# Patient Record
Sex: Male | Born: 1970 | Race: Black or African American | Hispanic: No | Marital: Single | Smoking: Never smoker
Health system: Southern US, Community
[De-identification: ages and names within clinical notes are randomized; demographics above are authoritative.]

---

## 2015-02-28 ENCOUNTER — Emergency Department
Admit: 2015-02-28 | Disposition: A | Discharge status: Home / Self Care | Payer: Self-pay | Admitting: Emergency Medicine

## 2015-02-28 LAB — COMPREHENSIVE METABOLIC PANEL
ALT: 24 U/L
AST: 23 U/L
Albumin: 4.4 g/dL
Alkaline Phosphatase: 56 U/L
Anion Gap: 7 (ref 7–16)
BILIRUBIN TOTAL: 0.4 mg/dL
BUN: 15 mg/dL
CO2: 26 mmol/L
Calcium, Total: 9.1 mg/dL
Chloride: 101 mmol/L
Creatinine: 1.08 mg/dL
EGFR (African American): >60
GLUCOSE: 121 mg/dL — AB
POTASSIUM: 3.4 mmol/L — AB
Sodium: 134 mmol/L — ABNORMAL LOW
Total Protein: 8.2 g/dL — ABNORMAL HIGH

## 2015-02-28 LAB — CBC WITH DIFFERENTIAL/PLATELET
BASOS PCT: 0.5 %
Basophil #: 0 10*3/uL (ref 0.0–0.1)
EOS ABS: 0.1 10*3/uL (ref 0.0–0.7)
Eosinophil %: 1.5 %
HCT: 41 % (ref 40.0–52.0)
HGB: 13.5 g/dL (ref 13.0–18.0)
Lymphocyte #: 2.7 10*3/uL (ref 1.0–3.6)
Lymphocyte %: 32.6 %
MCH: 29.6 pg (ref 26.0–34.0)
MCHC: 33 g/dL (ref 32.0–36.0)
MCV: 90 fL (ref 80–100)
MONO ABS: 0.7 x10 3/mm (ref 0.2–1.0)
Monocyte %: 8.1 %
NEUTROS ABS: 4.7 10*3/uL (ref 1.4–6.5)
Neutrophil %: 57.3 %
Platelet: 258 10*3/uL (ref 150–440)
RBC: 4.57 10*6/uL (ref 4.40–5.90)
RDW: 13.5 % (ref 11.5–14.5)
WBC: 8.2 10*3/uL (ref 3.8–10.6)

## 2015-09-20 ENCOUNTER — Encounter: Payer: Self-pay | Admitting: Emergency Medicine

## 2015-09-20 ENCOUNTER — Ambulatory Visit
Admission: EM | Admit: 2015-09-20 | Discharge: 2015-09-20 | Disposition: A | Discharge status: Home / Self Care | Payer: BLUE CROSS/BLUE SHIELD | Attending: Family Medicine | Admitting: Family Medicine

## 2015-09-20 DIAGNOSIS — M79605 Pain in left leg: Secondary | ICD-10-CM

## 2015-09-20 DIAGNOSIS — M7989 Other specified soft tissue disorders: Secondary | ICD-10-CM

## 2015-09-20 DIAGNOSIS — M79662 Pain in left lower leg: Secondary | ICD-10-CM

## 2015-09-20 NOTE — ED Notes (Signed)
Pt with left leg swelling x 2 weeks

## 2015-09-20 NOTE — ED Provider Notes (Signed)
[facility]  ____________________________________________  Time seen: Approximately 5:56 PM  I have reviewed the triage vital signs and the nursing notes.   HISTORY  Chief Complaint Leg Swelling   HPI Nathan Blake is a 44 y.o. male  presents with a complaint of left lower leg swelling 2 weeks. Patient states that this is a gradual onset. Patient denies fall or injury.  States he has some occasional swelling in both legs after standing and sitting long periods but states left leg has persisted. States some left lower leg pain. States left lower leg pain is 4 out of 10 aching and feels tight. States he did notice an area of redness with some break in skin. Patient states that he has had a scar to his left lower leg for years and states around that area down there is scaly skin with a break in skin. Denies drainage. Reports continues to ambulate well but with pain in left lower leg. States pain is primarily with walking. States some pain at rest. States pain is also present with areas touched. Denies pain in left lower extremity above left distal lower leg.   Denies chest pain, shortness of breath, abdominal pain, right lower extremity pain. Denies history of similar. States that he feels well other than redness, swelling and tenderness to left lower leg.  Patient states that he came tonight to make sure that it was not a blood clot.   History reviewed. No pertinent past medical history.  There are no active problems to display for this patient.   History reviewed. No pertinent past surgical history.  Current Outpatient Rx  Name  Route  Sig  Dispense  Refill  . cyclobenzaprine (FLEXERIL) 10 MG tablet   Oral   Take 10 mg by mouth 3 (three) times daily as needed for muscle spasms.           Allergies Review of patient's allergies indicates no known allergies.  History reviewed. No pertinent family history.  Social History Social History  Substance Use Topics  .  Smoking status: Never Smoker   . Smokeless tobacco: None  . Alcohol Use: Yes    Review of Systems Constitutional: No fever/chills Eyes: No visual changes. ENT: No sore throat. Cardiovascular: Denies chest pain. Respiratory: Denies shortness of breath. Gastrointestinal: No abdominal pain.  No nausea, no vomiting.  No diarrhea.  No constipation. Genitourinary: Negative for dysuria. Musculoskeletal: Negative for back pain. left leg pain as above.  Skin: Negative for rash. Neurological: Negative for headaches, focal weakness or numbness.  10-point ROS otherwise negative.  ____________________________________________   PHYSICAL EXAM:  VITAL SIGNS: ED Triage Vitals  Enc Vitals Group     BP 09/20/15 1723 135/77 mmHg     Pulse Rate 09/20/15 1723 91     Resp 09/20/15 1723 20     Temp 09/20/15 1723 98.3 F (36.8 C)     Temp Source 09/20/15 1723 Tympanic     SpO2 09/20/15 1723 99 %     Weight 09/20/15 1723 330 lb (149.687 kg)     Height 09/20/15 1723  (1.93 m)     Head Cir --      Peak Flow --      Pain Score 09/20/15 1725 8     Pain Loc --      Pain Edu? --      Excl. in GC? --     Constitutional: Alert and oriented. Well appearing and in no acute distress. Eyes: Conjunctivae are  normal. PERRL. EOMI. Head: Atraumatic.  Nose: No congestion/rhinnorhea.  Mouth/Throat: Mucous membranes are moist. Neck: No stridor.  No cervical spine tenderness to palpation. Hematological/Lymphatic/Immunilogical: No cervical lymphadenopathy. Cardiovascular: Normal rate, regular rhythm. Grossly normal heart sounds.  Good peripheral circulation. Respiratory: Normal respiratory effort.  No retractions. Lungs CTAB. No wheezes, rales or rhonchi.  Gastrointestinal: Soft and nontender. No distention. Normal Bowel sounds.  No abdominal bruits. No CVA tenderness. Musculoskeletal: No lower or upper extremity tenderness nor edema.  No joint effusions. Bilateral pedal pulses equal and easily palpated.   Except: mild to moderate nonpitting edema left lower leg with mild to moderate distal tibial fibular area erythema with noted area medial distal tib-fib area of superficial abrasion with mild scaling skin. No fluctuance or induration. No drainage. Calf nontender.Bilateral dorsalis pedis and posterior tibialis pulses equal and easily palpated.  Neurologic:  Normal speech and language. No gross focal neurologic deficits are appreciated. No gait instability. Skin:  Skin is warm, dry and intact. No rash noted. Psychiatric: Mood and affect are normal. Speech and behavior are normal.  ____________________________________________   LABS (all labs ordered are listed, but only abnormal results are displayed)  Labs Reviewed - No data to display __________________________________________   INITIAL IMPRESSION / ASSESSMENT AND PLAN / ED COURSE  Pertinent labs & imaging results that were available during my care of the patient were reviewed by me and considered in my medical decision making (see chart for details).  Very well-appearing patient. No acute distress. Patient was ambulating and up and walking in room upon entering room. Presents for approximately 2 weeks of gradual onset of redness, swelling and tenderness to left distal leg. Patient with mild to moderate nonpitting edema left lower leg with mild to moderate distal tibial fibular area erythema with noted area medial distal tib-fib area of superficial abrasion with mild scaling skin. No fluctuance or induration. No drainage. Calf nontender. Suspect left lower leg cellulitis however discussed with patient need to rule out deep venous thrombosis. Discuss with this patient that we do not have the means to ultrasound or further evaluate this facility at this time however discussed with patient can have outpatient ultrasound performed tonight. Discussed importance of having this completed. Patient states that he has 2 teenage daughters at home and  cannot have any testing done tonight as he needs to go home. Patient states that he will follow-up with his primary care physician tomorrow. Discussed with patient starting on antibiotics prior to following up and patient states that he wants to have everything done at the same time and will see his doctor tomorrow.  Discussed risk and benefits of not having testing and further evaluation done. Discussed the risk of not having ultrasound completed of left lower extremity to rule out DVT. Discussed risk of even up to death. Patient alert and oriented with decisional capacity and verbalized he will follow-up with his primary care physician tomorrow and states that he did not want any further treatment or care from urgent care at this time. Patient left AGAINST MEDICAL ADVICE.  Discussed follow up with Primary care physician this week. Discussed follow up and return parameters including no resolution or any worsening concerns. Patient verbalized understanding and agreed to plan.  ____________________________________________   FINAL CLINICAL IMPRESSION(S) / ED DIAGNOSES  Final diagnoses:  Pain and swelling of left lower leg       Renford DillsLindsey Zehra Rucci, NP 09/20/15 1823    Renford DillsLindsey Mackenzie Groom, NP 09/22/15 [phone]

## 2015-10-07 IMAGING — CT CT HEAD WITHOUT CONTRAST
1 series · 16 of 30 positions shown, 20 images · non-contrast
Comparison: None.

CLINICAL DATA: Numbness in the arms.

EXAM:
CT HEAD WITHOUT CONTRAST
TECHNIQUE: Contiguous axial images were obtained from the base of the skull
through the vertex without intravenous contrast.

[Series 2: head wo · axial · 0.46mm/px · z∈[-52,+92]mm · 16 of 36 slices shown, 20 images]
[im 2/36  brain]
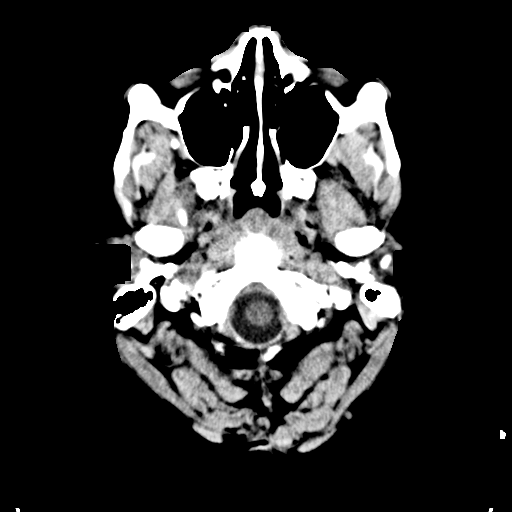
[im 2/36  bone]
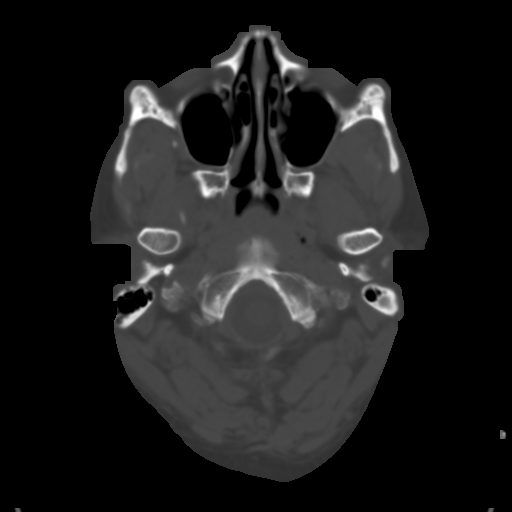
[im 4/36  brain]
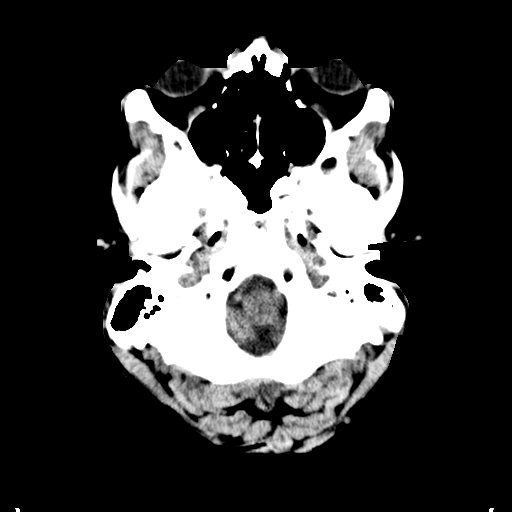
[im 7/36  brain]
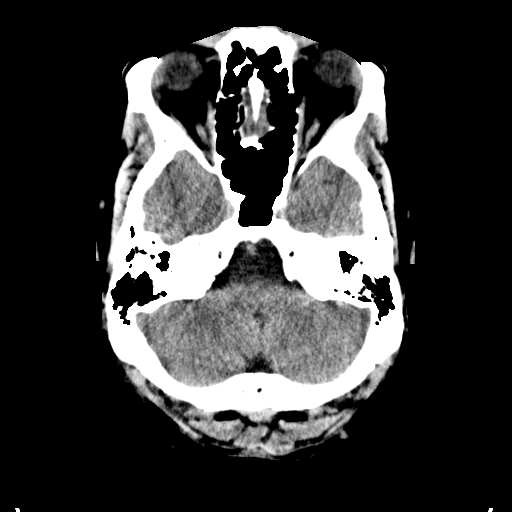
[im 9/36  brain]
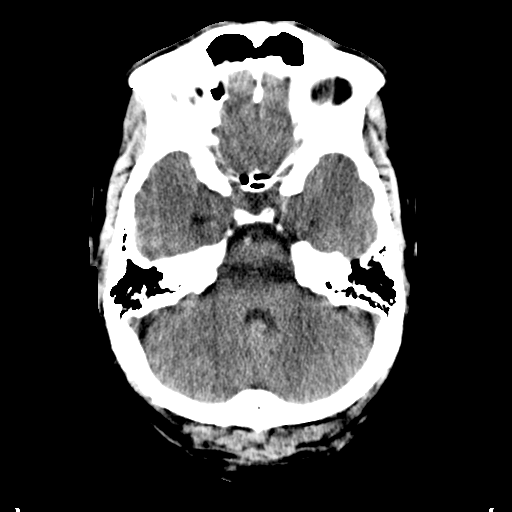
[im 10/36  brain]
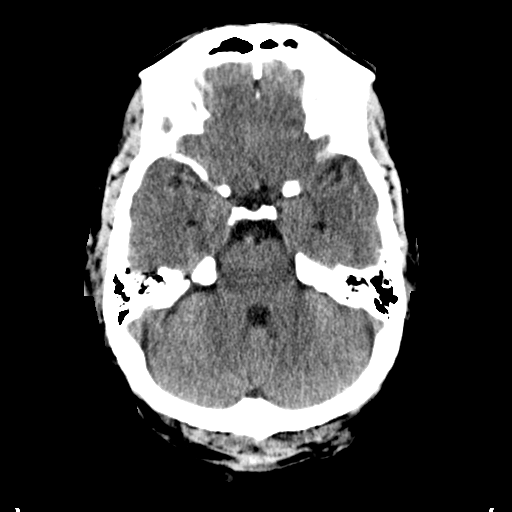
[im 10/36  bone]
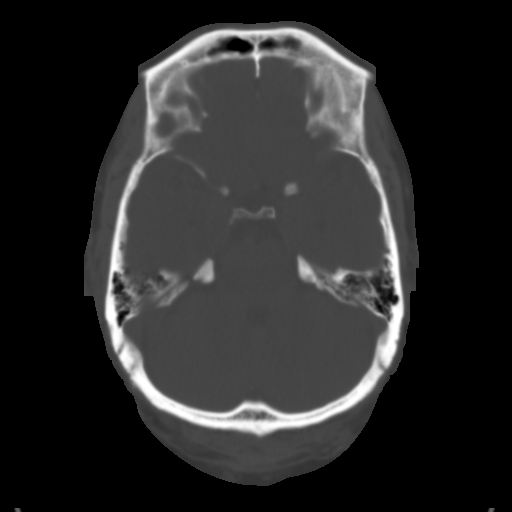
[im 13/36  brain]
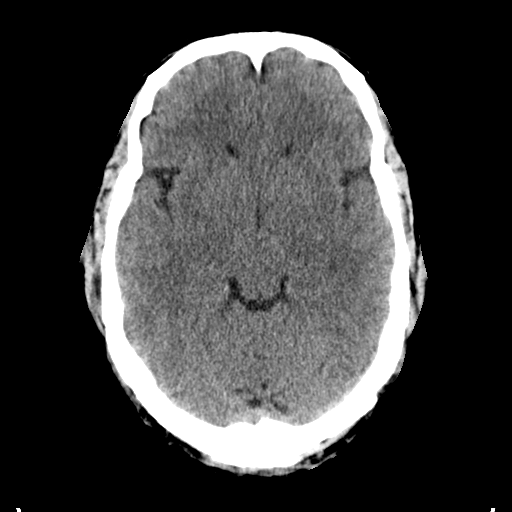
[im 15/36  brain]
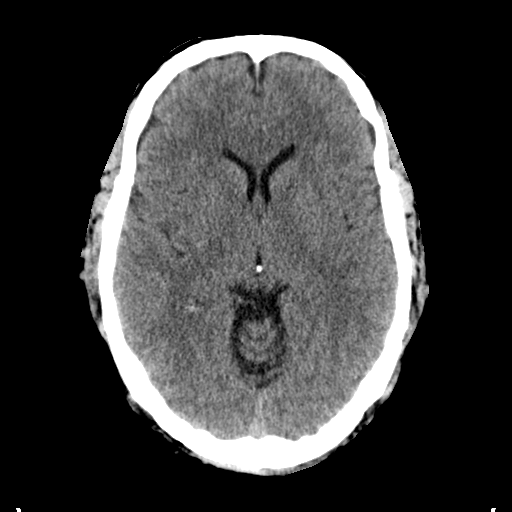
[im 17/36  brain]
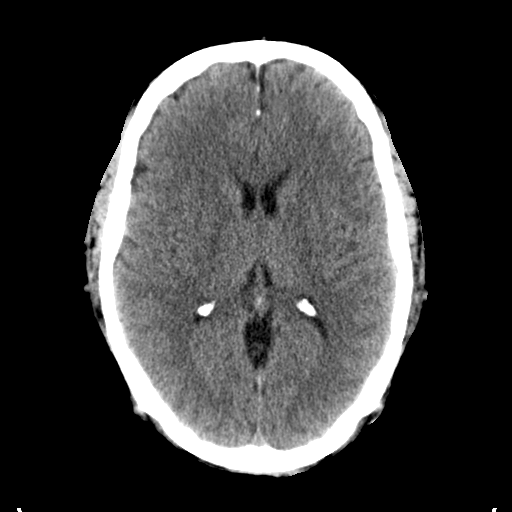
[im 19/36  brain]
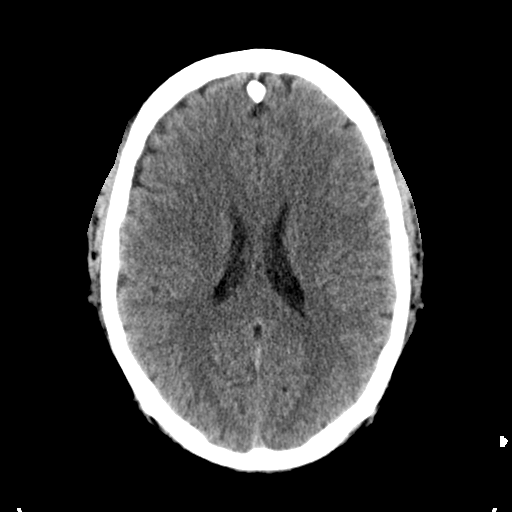
[im 19/36  bone]
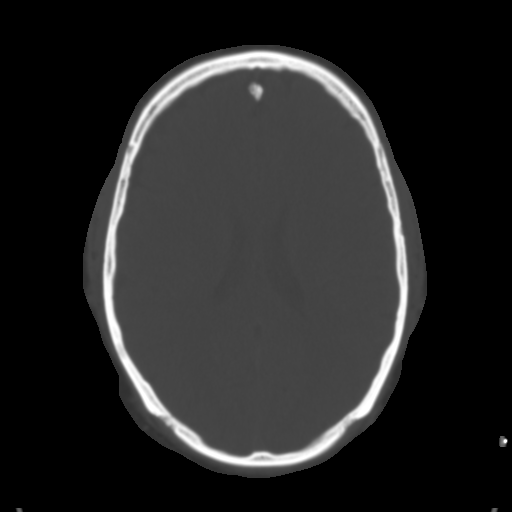
[im 21/36  brain]
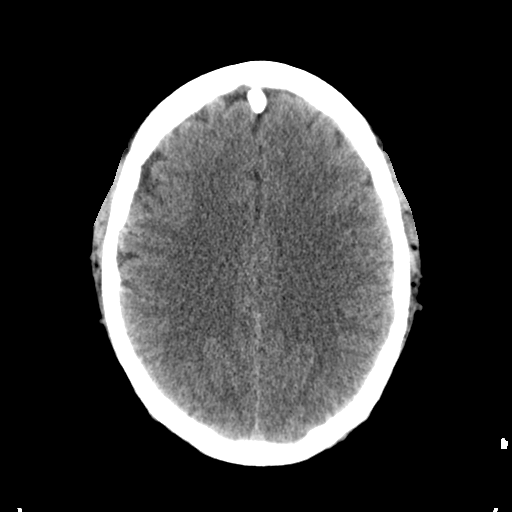
[im 23/36  brain]
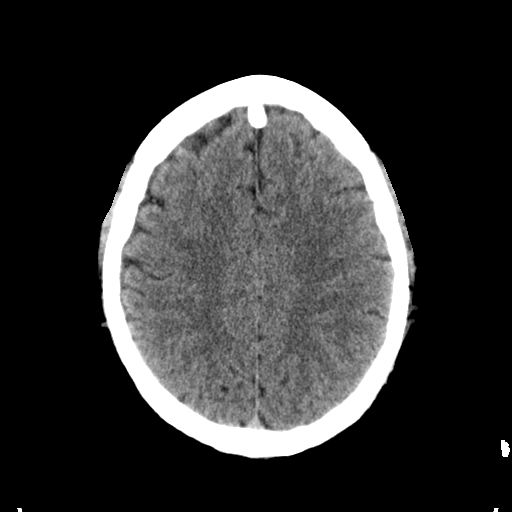
[im 26/36  brain]
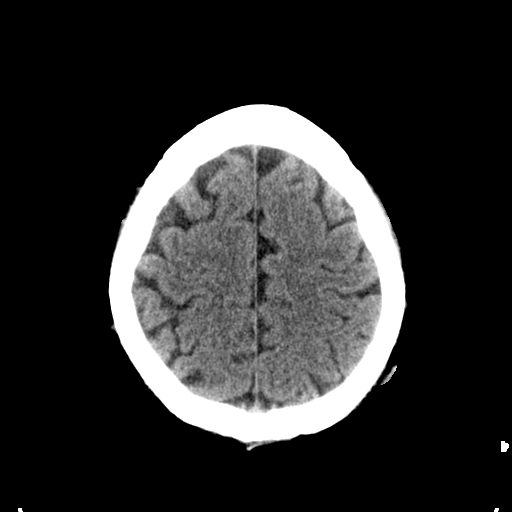
[im 27/36  brain]
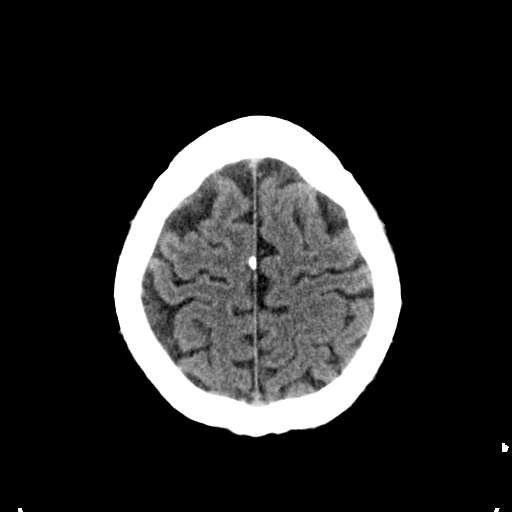
[im 27/36  bone]
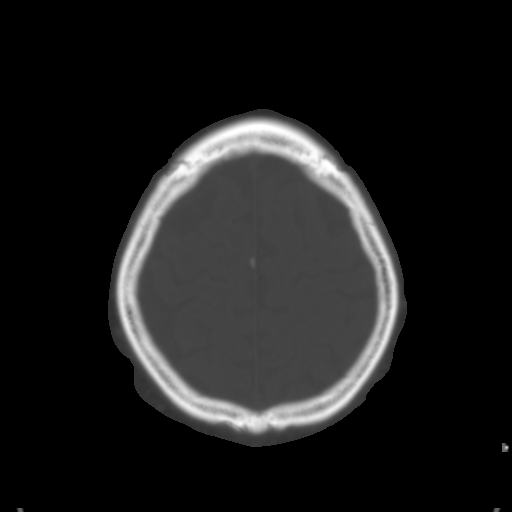
[im 29/36  brain]
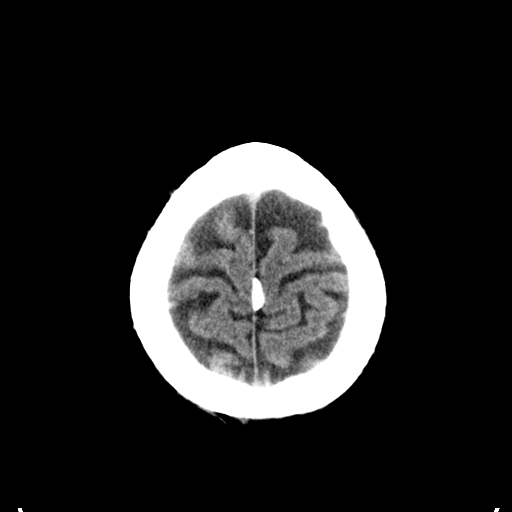
[im 32/36  brain]
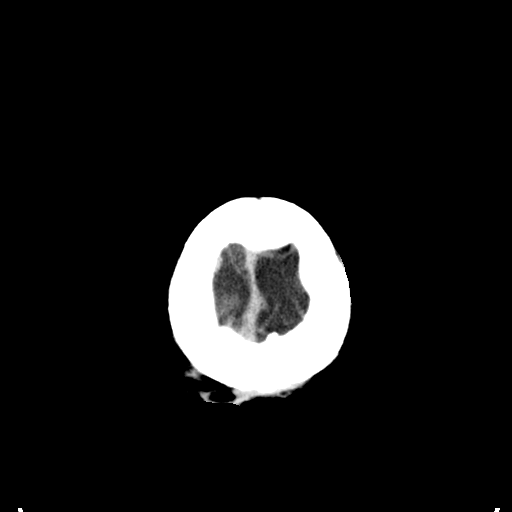
[im 34/36  brain]
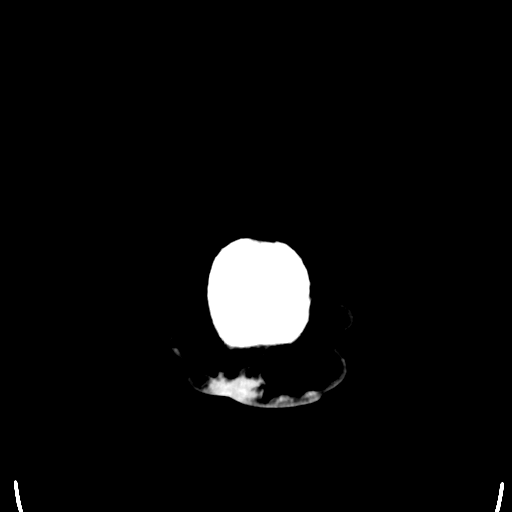

[16 of 30 positions shown; findings below may reference images not displayed]

FINDINGS: No mass effect, midline shift, or acute intracranial hemorrhage.
Cranium is intact. Mastoid air cells are clear. Cranium is intact.
Dural calcification along the falx.
IMPRESSION: Negative head CT.

## 2019-06-13 ENCOUNTER — Emergency Department
Admission: EM | Admit: 2019-06-13 | Discharge: 2019-06-13 | Disposition: A | Discharge status: Home / Self Care | Payer: BLUE CROSS/BLUE SHIELD | Attending: Emergency Medicine | Admitting: Emergency Medicine

## 2019-06-13 DIAGNOSIS — X30XXXA Exposure to excessive natural heat, initial encounter: Secondary | ICD-10-CM | POA: Insufficient documentation

## 2019-06-13 DIAGNOSIS — R55 Syncope and collapse: Secondary | ICD-10-CM | POA: Diagnosis present

## 2019-06-13 LAB — URINALYSIS, COMPLETE (UACMP) WITH MICROSCOPIC
Bacteria, UA: NONE SEEN
Bilirubin Urine: NEGATIVE
Glucose, UA: NEGATIVE mg/dL
Ketones, ur: 5 mg/dL — AB
Leukocytes,Ua: NEGATIVE
Nitrite: NEGATIVE
Protein, ur: NEGATIVE mg/dL
Specific Gravity, Urine: 1.01 (ref 1.005–1.030)
pH: 7 (ref 5.0–8.0)

## 2019-06-13 LAB — BASIC METABOLIC PANEL
Anion gap: 11 (ref 5–15)
BUN: 15 mg/dL (ref 6–20)
CO2: 21 mmol/L — ABNORMAL LOW (ref 22–32)
Calcium: 8.6 mg/dL — ABNORMAL LOW (ref 8.9–10.3)
Chloride: 103 mmol/L (ref 98–111)
Creatinine, Ser: 1.19 mg/dL (ref 0.61–1.24)
GFR calc Af Amer: >60 mL/min (ref >60)
GFR calc non Af Amer: >60 mL/min (ref >60)
Glucose, Bld: 98 mg/dL (ref 70–99)
Potassium: 3.3 mmol/L — ABNORMAL LOW (ref 3.5–5.1)
Sodium: 135 mmol/L (ref 135–145)

## 2019-06-13 LAB — CBC
HCT: 36 % — ABNORMAL LOW (ref 39.0–52.0)
Hemoglobin: 12 g/dL — ABNORMAL LOW (ref 13.0–17.0)
MCH: 28.5 pg (ref 26.0–34.0)
MCHC: 33.3 g/dL (ref 30.0–36.0)
MCV: 85.5 fL (ref 80.0–100.0)
Platelets: 235 10*3/uL (ref 150–400)
RBC: 4.21 MIL/uL — ABNORMAL LOW (ref 4.22–5.81)
RDW: 13.3 % (ref 11.5–15.5)
WBC: 4.8 10*3/uL (ref 4.0–10.5)
nRBC: 0 % (ref 0.0–0.2)

## 2019-06-13 LAB — CK: Total CK: 476 U/L — ABNORMAL HIGH (ref 49–397)

## 2019-06-13 LAB — HEPATIC FUNCTION PANEL
ALT: 19 U/L (ref 0–44)
AST: 22 U/L (ref 15–41)
Albumin: 3.8 g/dL (ref 3.5–5.0)
Alkaline Phosphatase: 51 U/L (ref 38–126)
Bilirubin, Direct: 0.1 mg/dL (ref 0.0–0.2)
Indirect Bilirubin: 0.9 mg/dL (ref 0.3–0.9)
Total Bilirubin: 1 mg/dL (ref 0.3–1.2)
Total Protein: 7.6 g/dL (ref 6.5–8.1)

## 2019-06-13 LAB — GLUCOSE, CAPILLARY: Glucose-Capillary: 91 mg/dL (ref 70–99)

## 2019-06-13 MED ORDER — SODIUM CHLORIDE 0.9 % IV BOLUS
1000.0000 mL | Freq: Once | INTRAVENOUS | Status: AC
  Administered 2019-06-13: 17:00:00 1000 mL via INTRAVENOUS

## 2019-06-13 NOTE — Discharge Instructions (Addendum)
Please rest tomorrow.  You may resume working Monday if you feel well.  Make sure you are drinking enough.  Would work for not more than an hour at a time and take breaks for possibly as much as 20 minutes where you are drinking.  Make sure you are getting some sports drinks for the electrolytes.  I would not drink more than a total of a gallon a day if you are working all day and sweating heavily.  Is best not to be out in the hot sun for more than a couple hours during the day when it is that hot and humid

## 2019-06-13 NOTE — ED Triage Notes (Signed)
Pt outside doing yardwork, because dizzy and had to sit down. Drank some water, feels better but wanted to be checked out. Denies dizziness currently. 20G to L hand started by EMS. Pt alert and oriented X4, active, cooperative, pt in NAD. RR even and unlabored, color WNL.

## 2019-06-13 NOTE — ED Provider Notes (Signed)
[facility] Emergency Department Provider Note   ____________________________________________   First MD Initiated Contact with Patient 06/13/19 1627     (approximate)  I have reviewed the triage vital signs and the nursing notes.   HISTORY  Chief Complaint Near Syncope and Heat Exposure    HPI Nathan Blake is a 48 y.o. male patient reports he was out in the hot sun working hard got all sweaty and got lightheaded and woozy.  EMS gave him a liter fluid but he wanted to come in and get checked out.  In the emergency room he feels a lot better he has drank some water labs look okay CKs is minimally elevated.  We will give him another liter fluid and see if he can get him home.         History reviewed. No pertinent past medical history.  There are no active problems to display for this patient.   History reviewed. No pertinent surgical history.  Prior to Admission medications   Medication Sig Start Date End Date Taking? Authorizing Provider  cyclobenzaprine (FLEXERIL) 10 MG tablet Take 10 mg by mouth 3 (three) times daily as needed for muscle spasms.    [provider]    Allergies Patient has no known allergies.  No family history on file.  Social History Social History   Tobacco Use   Smoking status: Never Smoker  Substance Use Topics   Alcohol use: Yes   Drug use: Not on file    Review of Systems  Constitutional: No fever/chills Eyes: No visual changes. ENT: No sore throat. Cardiovascular: Denies chest pain. Respiratory: Denies shortness of breath. Gastrointestinal: No abdominal pain.  No nausea, no vomiting.  No diarrhea.  No constipation. Genitourinary: Negative for dysuria. Musculoskeletal: Negative for back pain. Skin: Negative for rash. Neurological: Negative for headaches, focal weakness   ____________________________________________   PHYSICAL EXAM:  VITAL SIGNS: ED Triage Vitals [06/13/19 1350]    Enc Vitals Group     BP (!) 146/77     Pulse Rate 88     Resp 16     Temp 99.1 F (37.3 C)     Temp Source Oral     SpO2 100 %     Weight (!) 330 lb (149.7 kg)     Height 6\' 4"  (1.93 m)     Head Circumference      Peak Flow      Pain Score 0     Pain Loc      Pain Edu?      Excl. in GC?     Constitutional: Alert and oriented. Well appearing and in no acute distress. Eyes: Conjunctivae are normal.  Head: Atraumatic. Nose: No congestion/rhinnorhea. Mouth/Throat: Mucous membranes are moist.  Oropharynx non-erythematous. Neck: No stridor.   Cardiovascular: Normal rate, regular rhythm. Grossly normal heart sounds.  Good peripheral circulation. Respiratory: Normal respiratory effort.  No retractions. Lungs CTAB. Gastrointestinal: Soft and nontender. No distention. No abdominal bruits. No CVA tenderness. Musculoskeletal: No lower extremity tenderness trace bilateral edema.  Neurologic:  Normal speech and language. No gross focal neurologic deficits are appreciated.  Skin:  Skin is warm, dry and intact. No rash noted.   ____________________________________________   LABS (all labs ordered are listed, but only abnormal results are displayed)  Labs Reviewed  BASIC METABOLIC PANEL - Abnormal; Notable for the following components:      Result Value   Potassium 3.3 (*)    CO2 21 (*)  Calcium 8.6 (*)    All other components within normal limits  CBC - Abnormal; Notable for the following components:   RBC 4.21 (*)    Hemoglobin 12.0 (*)    HCT 36.0 (*)    All other components within normal limits  URINALYSIS, COMPLETE (UACMP) WITH MICROSCOPIC - Abnormal; Notable for the following components:   Color, Urine YELLOW (*)    APPearance CLEAR (*)    Hgb urine dipstick SMALL (*)    Ketones, ur 5 (*)    All other components within normal limits  CK - Abnormal; Notable for the following components:   Total CK 476 (*)    All other components within normal limits  GLUCOSE,  CAPILLARY  HEPATIC FUNCTION PANEL  CBG MONITORING, ED   ____________________________________________  EKG EKG read and interpreted by me shows normal sinus rhythm rate of 82 normal axis no acute changes  ____________________________________________  RADIOLOGY  ED MD interpretation:   Official radiology report(s): No results found.  ____________________________________________   PROCEDURES  Procedure(s) performed (including Critical Care):  Procedures   ____________________________________________   INITIAL IMPRESSION / ASSESSMENT AND PLAN / ED COURSE  Patient feeling better.  He should be on to go home.  I have him rest tomorrow and if he feels well on Monday he can resume working with more fluids sports drinks etc. as I discussed with him.    Nathan Blake was evaluated in Emergency Department on 06/13/2019 for the symptoms described in the history of present illness. He was evaluated in the context of the global COVID-19 pandemic, which necessitated consideration that the patient might be at risk for infection with the SARS-CoV-2 virus that causes COVID-19. Institutional protocols and algorithms that pertain to the evaluation of patients at risk for COVID-19 are in a state of rapid change based on information released by regulatory bodies including the CDC and federal and state organizations. These policies and algorithms were followed during the patient's care in the ED.         ____________________________________________   FINAL CLINICAL IMPRESSION(S) / ED DIAGNOSES  Final diagnoses:  Exposure to excessive natural heat as cause of accidental injury, initial encounter  Near syncope  Actual diagnosis is heat injury but this is not available in the computer   ED Discharge Orders    None       Note:  This document was prepared using Dragon voice recognition software and may include unintentional dictation errors.    Nena Polio, MD 06/13/19 [phone]

## 2019-06-13 NOTE — ED Notes (Signed)
Pt states he was outside for about 2 hours, c/o dizziness. At this denies dizziness at this time. Denies fever. Denies N/V. Alert and oriented at this time.

## 2019-06-13 NOTE — ED Notes (Signed)
First Nurse Note: Pt brought in by [facility] from home for heat exposure. Pt had been outside push mowing x 1. 5 hours. Pt was given 1000 mL of normal saline by EMS. Vital signs WNL. Pt is in NAD.  18 G IV in Left hand

## 2024-09-10 ENCOUNTER — Ambulatory Visit

## 2024-09-10 DIAGNOSIS — K641 Second degree hemorrhoids: Secondary | ICD-10-CM | POA: Diagnosis not present

## 2024-09-10 DIAGNOSIS — D122 Benign neoplasm of ascending colon: Secondary | ICD-10-CM | POA: Diagnosis not present

## 2024-09-10 DIAGNOSIS — Z1211 Encounter for screening for malignant neoplasm of colon: Secondary | ICD-10-CM | POA: Diagnosis present
